# Patient Record
Sex: Male | Born: 1985 | Race: Black or African American | Hispanic: No | Marital: Single | Smoking: Current some day smoker
Health system: Southern US, Community
[De-identification: ages and names within clinical notes are randomized; demographics above are authoritative.]

## PROBLEM LIST (undated history)

## (undated) DIAGNOSIS — S069XAA Unspecified intracranial injury with loss of consciousness status unknown, initial encounter: Secondary | ICD-10-CM

## (undated) HISTORY — PX: ANKLE SURGERY: SHX546

## (undated) HISTORY — PX: HERNIA REPAIR: SHX51

---

## 2021-12-28 ENCOUNTER — Emergency Department: Payer: Self-pay

## 2021-12-28 ENCOUNTER — Other Ambulatory Visit: Payer: Self-pay

## 2021-12-28 ENCOUNTER — Emergency Department
Admission: EM | Admit: 2021-12-28 | Discharge: 2021-12-28 | Disposition: A | Discharge status: Home / Self Care | Payer: Self-pay | Attending: Emergency Medicine | Admitting: Emergency Medicine

## 2021-12-28 DIAGNOSIS — R000 Tachycardia, unspecified: Secondary | ICD-10-CM | POA: Diagnosis not present

## 2021-12-28 DIAGNOSIS — R519 Headache, unspecified: Secondary | ICD-10-CM | POA: Insufficient documentation

## 2021-12-28 DIAGNOSIS — M545 Low back pain, unspecified: Secondary | ICD-10-CM | POA: Insufficient documentation

## 2021-12-28 DIAGNOSIS — Y92410 Unspecified street and highway as the place of occurrence of the external cause: Secondary | ICD-10-CM | POA: Diagnosis not present

## 2021-12-28 DIAGNOSIS — R531 Weakness: Secondary | ICD-10-CM | POA: Diagnosis not present

## 2021-12-28 HISTORY — DX: Unspecified intracranial injury with loss of consciousness status unknown, initial encounter: S06.9XAA

## 2021-12-28 MED ORDER — HYDROCODONE-ACETAMINOPHEN 5-325 MG PO TABS
1.0000 | ORAL_TABLET | Freq: Once | ORAL | Status: AC
  Administered 2021-12-28: 1 via ORAL
  Filled 2021-12-28: qty 1
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
## 2021-12-28 NOTE — Discharge Instructions (Addendum)
-  Return to the emergency department anytime if you begin to express any new or worsening symptoms. -Treat pain with Tylenol and ibuprofen as needed -Follow-up with your primary care provider, as discussed.

## 2022-03-20 ENCOUNTER — Other Ambulatory Visit: Payer: Self-pay

## 2022-03-20 ENCOUNTER — Emergency Department
Admission: EM | Admit: 2022-03-20 | Discharge: 2022-03-20 | Disposition: A | Discharge status: Home / Self Care | Payer: Self-pay | Attending: Emergency Medicine | Admitting: Emergency Medicine

## 2022-03-20 ENCOUNTER — Telehealth: Payer: Self-pay | Admitting: Emergency Medicine

## 2022-03-20 DIAGNOSIS — A64 Unspecified sexually transmitted disease: Secondary | ICD-10-CM | POA: Diagnosis not present

## 2022-03-20 DIAGNOSIS — R369 Urethral discharge, unspecified: Secondary | ICD-10-CM | POA: Diagnosis present

## 2022-03-20 LAB — CHLAMYDIA/NGC RT PCR (ARMC ONLY)
Chlamydia Tr: NOT DETECTED
N gonorrhoeae: DETECTED — AB

## 2022-03-20 MED ORDER — CEFTRIAXONE SODIUM 1 G IJ SOLR
500.0000 mg | Freq: Once | INTRAMUSCULAR | Status: AC
  Administered 2022-03-20: 500 mg via INTRAMUSCULAR
  Filled 2022-03-20: qty 10

## 2022-03-20 MED ORDER — DOXYCYCLINE HYCLATE 100 MG PO TABS: 100.0000 mg | ORAL_TABLET | Freq: Two times a day (BID) | ORAL | 0 refills | Status: AC

## 2022-03-20 MED ORDER — DOXYCYCLINE HYCLATE 100 MG PO TABS
100.0000 mg | ORAL_TABLET | Freq: Once | ORAL | Status: AC
  Administered 2022-03-20: 100 mg via ORAL
  Filled 2022-03-20: qty 1

## 2022-03-20 NOTE — ED Provider Notes (Signed)
? ?  [facility] ?Provider Note ? ? ? Event Date/Time  ? First MD Initiated Contact with Patient 03/20/22 [phone]   ?  (approximate) ? ? ?History  ? ?Penile Discharge ? ? ?HPI ? ?Patrick Oliver is a 36 y.o. male with no significant past medical history who presents with complaints of penile discharge.  Patient reports 2 days of discharge, no dysuria.  Recent sexual intercourse, unprotected.  No fevers, no rash ?  ? ? ?Physical Exam  ? ?Triage Vital Signs: ?ED Triage Vitals  ?Enc Vitals Group  ?   BP 03/20/22 0721 132/87  ?   Pulse Rate 03/20/22 0721 (!) 105  ?   Resp 03/20/22 0721 16  ?   Temp 03/20/22 0721 98.2 ?F (36.8 ?C)  ?   Temp Source 03/20/22 0721 Oral  ?   SpO2 03/20/22 0721 97 %  ?   Weight 03/20/22 0722 99.8 kg (220 lb)  ?   Height 03/20/22 0722 1.702 m (5\' 7" )  ?   Head Circumference --   ?   Peak Flow --   ?   Pain Score 03/20/22 0722 0  ?   Pain Loc --   ?   Pain Edu? --   ?   Excl. in [place]? --   ? ? ?Most recent vital signs: ?Vitals:  ? 03/20/22 0721  ?BP: 132/87  ?Pulse: (!) 105  ?Resp: 16  ?Temp: 98.2 ?F (36.8 ?C)  ?SpO2: 97%  ? ? ? ?General: Awake, no distress.  ?CV:  Good peripheral perfusion.  ?Resp:  Normal effort.  ?Abd:  No distention.  ?Other:  GU: Clearish penile discharge otherwise normal exam ? ? ?ED Results / Procedures / Treatments  ? ?Labs ?(all labs ordered are listed, but only abnormal results are displayed) ?Labs Reviewed  ?CHLAMYDIA/NGC RT PCR ([facility] ONLY)            ? ? ? ?EKG ? ? ? ? ?RADIOLOGY ? ? ? ? ?PROCEDURES: ? ?Critical Care performed:  ? ?Procedures ? ? ?MEDICATIONS ORDERED IN ED: ?Medications  ?doxycycline (VIBRA-TABS) tablet 100 mg (has no administration in time range)  ?cefTRIAXone (ROCEPHIN) injection 500 mg (has no administration in time range)  ? ? ? ?IMPRESSION / MDM / ASSESSMENT AND PLAN / ED COURSE  ?I reviewed the triage vital signs and the nursing notes. ? ?Exam and HPI consistent with STI, will treat presumptively for gonorrhea chlamydia,  recommended follow-up at [facility] department for further [facility]I testing, [facility] ? ?We will treat with IM Rocephin, p.o. Doxy x7 days ? ? ? ? ? ?  ? ? ?FINAL CLINICAL IMPRESSION(S) / ED DIAGNOSES  ? ?Final diagnoses:  ?STI (sexually transmitted infection)  ? ? ? ?Rx / DC Orders  ? ?ED Discharge Orders   ? ?      Ordered  ?  doxycycline (VIBRA-TABS) 100 MG tablet  2 times daily       ? 03/20/22 0731  ? ?  ?  ? ?  ? ? ? ?Note:  This document was prepared using Dragon voice recognition software and may include unintentional dictation errors. ?  ?05/20/22, MD ?03/20/22 [phone] ? ?

## 2022-03-20 NOTE — ED Triage Notes (Signed)
Pt c/o penile discharge for the past 2 days ?

## 2022-03-20 NOTE — Telephone Encounter (Signed)
Called patient to inform of std results positive for gonorrhea.  Advised him to notify partner/s and that they need treatment as well.  Advised of free std treatment at [facility]. ?

## 2022-03-20 NOTE — ED Notes (Signed)
See triage note  presents with penile discharge  thinks he may have been exposed to an STD  denies any pain or fever ?
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
